# Patient Record
Sex: Female | Born: 1950 | Race: White | Hispanic: No | Marital: Single | Smoking: Never smoker
Health system: Southern US, Community
[De-identification: ages and names within clinical notes are randomized; demographics above are authoritative.]

## PROBLEM LIST (undated history)

## (undated) DIAGNOSIS — E069 Thyroiditis, unspecified: Secondary | ICD-10-CM

## (undated) DIAGNOSIS — N809 Endometriosis, unspecified: Secondary | ICD-10-CM

## (undated) DIAGNOSIS — M810 Age-related osteoporosis without current pathological fracture: Secondary | ICD-10-CM

## (undated) DIAGNOSIS — D219 Benign neoplasm of connective and other soft tissue, unspecified: Secondary | ICD-10-CM

## (undated) HISTORY — DX: Thyroiditis, unspecified: E06.9

## (undated) HISTORY — DX: Endometriosis, unspecified: N80.9

## (undated) HISTORY — DX: Age-related osteoporosis without current pathological fracture: M81.0

## (undated) HISTORY — PX: OOPHORECTOMY: SHX86

## (undated) HISTORY — DX: Benign neoplasm of connective and other soft tissue, unspecified: D21.9

## (undated) HISTORY — PX: APPENDECTOMY: SHX54

## (undated) HISTORY — PX: KNEE SURGERY: SHX244

---

## 1998-07-29 ENCOUNTER — Other Ambulatory Visit: Admission: RE | Admit: 1998-07-29 | Discharge: 1998-07-29 | Payer: Self-pay | Admitting: Obstetrics and Gynecology

## 1999-10-17 ENCOUNTER — Encounter: Payer: Self-pay | Admitting: Obstetrics and Gynecology

## 1999-10-17 ENCOUNTER — Encounter: Admission: RE | Admit: 1999-10-17 | Discharge: 1999-10-17 | Payer: Self-pay | Admitting: Obstetrics and Gynecology

## 1999-10-19 ENCOUNTER — Other Ambulatory Visit: Admission: RE | Admit: 1999-10-19 | Discharge: 1999-10-19 | Payer: Self-pay | Admitting: Obstetrics and Gynecology

## 2000-02-06 ENCOUNTER — Encounter: Admission: RE | Admit: 2000-02-06 | Discharge: 2000-02-06 | Payer: Self-pay | Admitting: Obstetrics and Gynecology

## 2000-02-06 ENCOUNTER — Encounter: Payer: Self-pay | Admitting: Obstetrics and Gynecology

## 2000-10-17 ENCOUNTER — Encounter: Admission: RE | Admit: 2000-10-17 | Discharge: 2000-10-17 | Payer: Self-pay | Admitting: Obstetrics and Gynecology

## 2000-10-17 ENCOUNTER — Encounter: Payer: Self-pay | Admitting: Obstetrics and Gynecology

## 2000-10-18 ENCOUNTER — Encounter: Payer: Self-pay | Admitting: Obstetrics and Gynecology

## 2000-10-18 ENCOUNTER — Encounter: Admission: RE | Admit: 2000-10-18 | Discharge: 2000-10-18 | Payer: Self-pay | Admitting: Obstetrics and Gynecology

## 2000-10-19 ENCOUNTER — Other Ambulatory Visit: Admission: RE | Admit: 2000-10-19 | Discharge: 2000-10-19 | Payer: Self-pay | Admitting: Obstetrics and Gynecology

## 2001-10-21 ENCOUNTER — Other Ambulatory Visit: Admission: RE | Admit: 2001-10-21 | Discharge: 2001-10-21 | Payer: Self-pay | Admitting: Obstetrics and Gynecology

## 2001-10-21 ENCOUNTER — Encounter: Payer: Self-pay | Admitting: Obstetrics and Gynecology

## 2001-10-21 ENCOUNTER — Encounter: Admission: RE | Admit: 2001-10-21 | Discharge: 2001-10-21 | Payer: Self-pay | Admitting: Obstetrics and Gynecology

## 2002-02-07 ENCOUNTER — Encounter: Admission: RE | Admit: 2002-02-07 | Discharge: 2002-02-07 | Payer: Self-pay | Admitting: Obstetrics and Gynecology

## 2002-02-07 ENCOUNTER — Encounter: Payer: Self-pay | Admitting: Obstetrics and Gynecology

## 2002-10-21 ENCOUNTER — Other Ambulatory Visit: Admission: RE | Admit: 2002-10-21 | Discharge: 2002-10-21 | Payer: Self-pay | Admitting: Obstetrics and Gynecology

## 2002-10-22 ENCOUNTER — Encounter: Payer: Self-pay | Admitting: Obstetrics and Gynecology

## 2002-10-22 ENCOUNTER — Encounter: Admission: RE | Admit: 2002-10-22 | Discharge: 2002-10-22 | Payer: Self-pay | Admitting: Obstetrics and Gynecology

## 2003-10-19 ENCOUNTER — Other Ambulatory Visit: Admission: RE | Admit: 2003-10-19 | Discharge: 2003-10-19 | Payer: Self-pay | Admitting: Obstetrics and Gynecology

## 2003-10-20 ENCOUNTER — Encounter: Admission: RE | Admit: 2003-10-20 | Discharge: 2003-10-20 | Payer: Self-pay | Admitting: Obstetrics and Gynecology

## 2004-02-08 ENCOUNTER — Encounter: Admission: RE | Admit: 2004-02-08 | Discharge: 2004-02-08 | Payer: Self-pay | Admitting: Obstetrics and Gynecology

## 2004-10-21 ENCOUNTER — Encounter: Admission: RE | Admit: 2004-10-21 | Discharge: 2004-10-21 | Payer: Self-pay | Admitting: Obstetrics and Gynecology

## 2004-10-25 ENCOUNTER — Other Ambulatory Visit: Admission: RE | Admit: 2004-10-25 | Discharge: 2004-10-25 | Payer: Self-pay | Admitting: Obstetrics and Gynecology

## 2005-10-26 ENCOUNTER — Other Ambulatory Visit: Admission: RE | Admit: 2005-10-26 | Discharge: 2005-10-26 | Payer: Self-pay | Admitting: Obstetrics and Gynecology

## 2006-01-31 ENCOUNTER — Encounter: Admission: RE | Admit: 2006-01-31 | Discharge: 2006-01-31 | Payer: Self-pay | Admitting: Obstetrics and Gynecology

## 2006-10-17 ENCOUNTER — Encounter: Admission: RE | Admit: 2006-10-17 | Discharge: 2006-10-17 | Payer: Self-pay | Admitting: Obstetrics and Gynecology

## 2006-10-18 ENCOUNTER — Other Ambulatory Visit: Admission: RE | Admit: 2006-10-18 | Discharge: 2006-10-18 | Payer: Self-pay | Admitting: Obstetrics and Gynecology

## 2007-10-22 ENCOUNTER — Encounter: Admission: RE | Admit: 2007-10-22 | Discharge: 2007-10-22 | Payer: Self-pay | Admitting: Obstetrics and Gynecology

## 2008-10-21 ENCOUNTER — Encounter: Admission: RE | Admit: 2008-10-21 | Discharge: 2008-10-21 | Payer: Self-pay | Admitting: Obstetrics and Gynecology

## 2008-10-21 ENCOUNTER — Other Ambulatory Visit: Admission: RE | Admit: 2008-10-21 | Discharge: 2008-10-21 | Payer: Self-pay | Admitting: Obstetrics and Gynecology

## 2008-10-21 ENCOUNTER — Ambulatory Visit: Payer: Self-pay | Admitting: Obstetrics and Gynecology

## 2009-10-20 ENCOUNTER — Encounter: Admission: RE | Admit: 2009-10-20 | Discharge: 2009-10-20 | Payer: Self-pay | Admitting: Obstetrics and Gynecology

## 2010-10-20 ENCOUNTER — Ambulatory Visit: Payer: Self-pay | Admitting: Gynecology

## 2010-10-21 ENCOUNTER — Encounter
Admission: RE | Admit: 2010-10-21 | Discharge: 2010-10-21 | Payer: Self-pay | Source: Home / Self Care | Attending: Obstetrics and Gynecology | Admitting: Obstetrics and Gynecology

## 2010-11-19 ENCOUNTER — Encounter: Payer: Self-pay | Admitting: Obstetrics and Gynecology

## 2010-11-20 ENCOUNTER — Encounter: Payer: Self-pay | Admitting: Obstetrics and Gynecology

## 2011-09-14 ENCOUNTER — Other Ambulatory Visit: Payer: Self-pay | Admitting: Obstetrics and Gynecology

## 2011-09-14 DIAGNOSIS — Z1231 Encounter for screening mammogram for malignant neoplasm of breast: Secondary | ICD-10-CM

## 2011-10-20 ENCOUNTER — Ambulatory Visit
Admission: RE | Admit: 2011-10-20 | Discharge: 2011-10-20 | Disposition: A | Discharge status: Home / Self Care | Payer: BC Managed Care – PPO | Source: Ambulatory Visit | Attending: Obstetrics and Gynecology | Admitting: Obstetrics and Gynecology

## 2011-10-20 DIAGNOSIS — Z1231 Encounter for screening mammogram for malignant neoplasm of breast: Secondary | ICD-10-CM

## 2012-08-26 ENCOUNTER — Other Ambulatory Visit: Payer: Self-pay | Admitting: Obstetrics and Gynecology

## 2012-08-26 DIAGNOSIS — Z1231 Encounter for screening mammogram for malignant neoplasm of breast: Secondary | ICD-10-CM

## 2012-10-16 ENCOUNTER — Encounter: Payer: Self-pay | Admitting: Gynecology

## 2012-10-18 ENCOUNTER — Encounter: Payer: Self-pay | Admitting: Obstetrics and Gynecology

## 2012-10-18 ENCOUNTER — Ambulatory Visit (INDEPENDENT_AMBULATORY_CARE_PROVIDER_SITE_OTHER): Payer: BC Managed Care – PPO | Admitting: Obstetrics and Gynecology

## 2012-10-18 ENCOUNTER — Other Ambulatory Visit (HOSPITAL_COMMUNITY)
Admission: RE | Admit: 2012-10-18 | Discharge: 2012-10-18 | Disposition: A | Discharge status: Home / Self Care | Payer: BC Managed Care – PPO | Source: Ambulatory Visit | Attending: Obstetrics and Gynecology | Admitting: Obstetrics and Gynecology

## 2012-10-18 VITALS — BP 120/76 | Ht 65.0 in | Wt 116.0 lb

## 2012-10-18 DIAGNOSIS — Z01419 Encounter for gynecological examination (general) (routine) without abnormal findings: Secondary | ICD-10-CM

## 2012-10-18 NOTE — Progress Notes (Signed)
The patient came to see me today for her annual GYN exam. She lives in [place] but comes to see me when she is here. She had a GYN exam one year ago in [place]. She is using componded oral estriol. She is using Vagifem. She is scheduled for yearly mammogram here. She has significant osteopenia but has not had a bone density since 2007. She previously tried oral biphosphonates but did not do well. She has not had any fractures. She is having no vaginal bleeding. She is having no pelvic pain. She has always had normal Pap smears. In 1989 she had a right salpingo-oophorectomy and left ovarian cystectomy for endometriosis. She is not having dyspareunia.  Physical examination:Kim Julian Reil present. HEENT within normal limits. Neck: Thyroid not large. No masses. Supraclavicular nodes: not enlarged. Breasts: Examined in both sitting and lying  position. No skin changes and no masses. Abdomen: Soft no guarding rebound or masses or hernia. Pelvic: External: Within normal limits. BUS: Within normal limits. Vaginal:within normal limits. Good estrogen effect. No evidence of cystocele rectocele or enterocele. Cervix: clean. Uterus: Normal size and shape. Adnexa: No masses. Rectovaginal exam: Confirmatory and negative. Extremities: Within normal limits.  Assessment: #1. Osteopenia #2. Menopausal symptoms responding to treatment.  Plan: Mammogram. Bone density in . Treatment for bone loss in . We discussed Prolia today. Asked her to talk to the gynecologist who prescribes estriol to see if she can give it to her in a non-oral form. Continue Vagifem. Pap done.The new Pap smear guidelines were discussed with the patient.

## 2012-10-18 NOTE — Patient Instructions (Signed)
Mammogram. Schedule  bone density in [place].

## 2012-10-19 LAB — URINALYSIS W MICROSCOPIC + REFLEX CULTURE
Bacteria, UA: NONE SEEN
Casts: NONE SEEN
Crystals: NONE SEEN
Glucose, UA: NEGATIVE mg/dL
Hgb urine dipstick: NEGATIVE
Ketones, ur: NEGATIVE mg/dL
Leukocytes, UA: NEGATIVE
Nitrite: NEGATIVE
Specific Gravity, Urine: 1.011 (ref 1.005–1.030)
Urobilinogen, UA: 0.2 mg/dL (ref 0.0–1.0)
pH: 6.5 (ref 5.0–8.0)

## 2012-10-21 ENCOUNTER — Ambulatory Visit
Admission: RE | Admit: 2012-10-21 | Discharge: 2012-10-21 | Disposition: A | Discharge status: Home / Self Care | Payer: BC Managed Care – PPO | Source: Ambulatory Visit | Attending: Obstetrics and Gynecology | Admitting: Obstetrics and Gynecology

## 2012-10-21 DIAGNOSIS — Z1231 Encounter for screening mammogram for malignant neoplasm of breast: Secondary | ICD-10-CM

## 2013-09-11 ENCOUNTER — Other Ambulatory Visit: Payer: Self-pay

## 2013-09-11 DIAGNOSIS — Z1231 Encounter for screening mammogram for malignant neoplasm of breast: Secondary | ICD-10-CM

## 2013-10-20 ENCOUNTER — Ambulatory Visit
Admission: RE | Admit: 2013-10-20 | Discharge: 2013-10-20 | Disposition: A | Discharge status: Home / Self Care | Payer: BC Managed Care – PPO | Source: Ambulatory Visit

## 2013-10-20 DIAGNOSIS — Z1231 Encounter for screening mammogram for malignant neoplasm of breast: Secondary | ICD-10-CM

## 2013-10-21 ENCOUNTER — Ambulatory Visit (INDEPENDENT_AMBULATORY_CARE_PROVIDER_SITE_OTHER): Payer: BC Managed Care – PPO | Admitting: Gynecology

## 2013-10-21 ENCOUNTER — Encounter: Payer: Self-pay | Admitting: Gynecology

## 2013-10-21 VITALS — BP 106/64 | Ht 65.0 in | Wt 120.6 lb

## 2013-10-21 DIAGNOSIS — M8580 Other specified disorders of bone density and structure, unspecified site: Secondary | ICD-10-CM

## 2013-10-21 DIAGNOSIS — Z01419 Encounter for gynecological examination (general) (routine) without abnormal findings: Secondary | ICD-10-CM

## 2013-10-21 DIAGNOSIS — Z79890 Hormone replacement therapy (postmenopausal): Secondary | ICD-10-CM

## 2013-10-21 DIAGNOSIS — N952 Postmenopausal atrophic vaginitis: Secondary | ICD-10-CM

## 2013-10-21 DIAGNOSIS — M899 Disorder of bone, unspecified: Secondary | ICD-10-CM

## 2013-10-21 NOTE — Patient Instructions (Signed)
Followup for bone density. Schedule colonoscopy. Followup for annual exam in one year.

## 2013-10-21 NOTE — Progress Notes (Signed)
Roberta Logan 05-28-1951 [id number]        62 y.o.  G0P0 for annual exam.  Former patient of Dr. Eda Paschal. Several issues noted below.  Past medical history,surgical history, problem list, medications, allergies, family history and social history were all reviewed and documented in the EPIC chart.  ROS:  Performed and pertinent positives and negatives are included in the history, assessment and plan .  Exam: [name] assistant Filed Vitals:   10/21/13 1102  BP: 106/64  Height: 5\' 5"  (1.651 m)  Weight: 120 lb 9.6 oz (54.704 kg)   General appearance  Normal Skin grossly normal Head/Neck normal with no cervical or supraclavicular adenopathy thyroid normal Lungs  clear Cardiac RR, without RMG Abdominal  soft, nontender, without masses, organomegaly or hernia Breasts  examined lying and sitting without masses, retractions, discharge or axillary adenopathy. Pelvic  Ext/BUS/vagina  Normal with atrophic changes  Cervix  Normal with atrophic changes  Uterus  anteverted, normal size, shape and contour, midline and mobile nontender   Adnexa  Without masses or tenderness    Anus and perineum  Normal   Rectovaginal  Normal sphincter tone without palpated masses or tenderness.    Assessment/Plan:  62 y.o. G0P0 female for annual exam.   1. Postmenopausal/HRT. History of endometriosis and leiomyoma status post laparoscopic right salpingo-oophorectomy and left ovarian cystectomy 1989. Patient is on a bioequivalent estriol and progesterone cream to a physician/compounding facility in [place]. Also uses Vagifem intermittently as needed for vaginal dryness. Has been on this for years and wants to continue.  I reviewed the whole issue of HRT with her to include the WHI study with increased risk of stroke, heart attack, DVT and breast cancer. The ACOG and NAMS statements for lowest dose for the shortest period of time reviewed. Transdermal versus oral first-pass effect benefit discussed.  The issues  of bioequivalent hormones and less well studied formulations. Whether progesterone cream protects the endometrium and whether pharmaceutical grade studies applied to bioequivalent formulated medications all reviewed. Patient understands the issues and risks and wants to continue and she'll continue to arrange this through her [facility] facility. 2. Osteopenia. DEXA 2007 with T score -2.4. Transiently on Fosamax/Boniva but discontinued for what sounds like more of her concerns about the risks rather than the side effects.  I reviewed with her her significant risk of fracture given her thin status Caucasian race. HRT beneficial but not guaranteed protective. Risks benefits of various medications to include bisphosphate temporally a reviewed. Osteonecrosis of the jaw, atypical fractures particularly with prolonged use, GERD and esophageal cancer as well as rashes and infections all discussed. Strongly recommended patient followup for bone density and discussion about treatment options. Patient was to arrange in [place] per Dr. Verl Dicker note but never did so. She agrees to arrange either they or or schedule one here when she comes back. Increase calcium and vitamin D reviewed. 3. Pap smear 2013. No Pap smear done today. No history of abnormal Pap smears previously. Plan repeat at 3 year intervals. 4. Mammography 09/2013. Continue with annual mammography. SBE monthly reviewed. 5. Colonoscopy 2002. Strongly recommended scheduling colonoscopy either here or in [place]. Benefits of early detection and precancerous polyp removal discussed. Patient agrees to arrange. 6. Health maintenance. The blood work done as this has all been done through her primary physician in [place] and was reportedly just recently done. Followup as noted above.   Note: This document was prepared with digital dictation and possible smart phrase technology. Any transcriptional errors that  result from this process are  unintentional.   Dara Lords MD, 11:37 AM 10/21/2013

## 2013-10-22 LAB — URINALYSIS W MICROSCOPIC + REFLEX CULTURE
Bacteria, UA: NONE SEEN
Bilirubin Urine: NEGATIVE
Casts: NONE SEEN
Crystals: NONE SEEN
Glucose, UA: NEGATIVE mg/dL
Hgb urine dipstick: NEGATIVE
Ketones, ur: NEGATIVE mg/dL
Leukocytes, UA: NEGATIVE
Nitrite: NEGATIVE
Protein, ur: NEGATIVE mg/dL
Specific Gravity, Urine: 1.01 (ref 1.005–1.030)
Squamous Epithelial / HPF: NONE SEEN
Urobilinogen, UA: 0.2 mg/dL (ref 0.0–1.0)
pH: 6.5 (ref 5.0–8.0)

## 2014-02-11 ENCOUNTER — Telehealth: Payer: Self-pay | Admitting: *Deleted

## 2014-02-11 NOTE — Telephone Encounter (Signed)
Pt called c/o increase price of vagifem 10 mcg to $200 too expensive for patient. Pt asked if there are any other options for her to try? Please advise

## 2014-02-11 NOTE — Telephone Encounter (Signed)
She can try vaginal estrogen cream such as Premarin/Estrace 1 g intravaginal twice weekly, which can be expensive and $70-$80 a month. [facility] formulated estradiol vaginal cream twice weekly was $45 for 3 months supply last time we checked.

## 2014-02-12 NOTE — Telephone Encounter (Signed)
Pt said she would like to try premarin or estrace. She asked if you would choose which would best of here. Please advise

## 2014-02-13 MED ORDER — ESTROGENS, CONJUGATED 0.625 MG/GM VA CREA: TOPICAL_CREAM | VAGINAL | Status: DC

## 2014-02-13 NOTE — Telephone Encounter (Signed)
Premarin vaginal cream one quarter applicator twice weekly dispense one tube refill x6. Tell patient to check with website as they may have a coupon to help with cost.

## 2014-02-13 NOTE — Telephone Encounter (Signed)
Patient aware rx sent  

## 2014-02-16 ENCOUNTER — Telehealth: Payer: Self-pay | Admitting: *Deleted

## 2014-02-16 NOTE — Telephone Encounter (Signed)
Rx faxed to [phone]

## 2014-02-16 NOTE — Telephone Encounter (Signed)
Okay for Vagifem 10 mcg #24, one per vagina twice weekly refill x4

## 2014-02-16 NOTE — Telephone Encounter (Signed)
Pt lives in [place] had Rx sent on 02/11/14 for premarin vaginal cream, has decided not to take medication. Pt said she would like to continue vagifem 10 mcg she has found a pharmacy in [place] that will be a lot cheaper for her to get medication. She gave me the fax # for Rx to be faxed. We are not able to fax printed Rx from epic. If you are okay with this pt will need a written Rx on prescription pad for vagifem, once this has been done I will fax. Please advise

## 2014-09-28 ENCOUNTER — Other Ambulatory Visit: Payer: Self-pay

## 2014-09-28 DIAGNOSIS — Z1231 Encounter for screening mammogram for malignant neoplasm of breast: Secondary | ICD-10-CM

## 2014-10-02 ENCOUNTER — Ambulatory Visit (INDEPENDENT_AMBULATORY_CARE_PROVIDER_SITE_OTHER): Payer: BC Managed Care – PPO | Admitting: Women's Health

## 2014-10-02 ENCOUNTER — Encounter: Payer: Self-pay | Admitting: Women's Health

## 2014-10-02 ENCOUNTER — Other Ambulatory Visit (HOSPITAL_COMMUNITY)
Admission: RE | Admit: 2014-10-02 | Discharge: 2014-10-02 | Disposition: A | Discharge status: Home / Self Care | Payer: BC Managed Care – PPO | Source: Ambulatory Visit | Attending: Gynecology | Admitting: Gynecology

## 2014-10-02 VITALS — BP 118/80 | Wt 122.0 lb

## 2014-10-02 DIAGNOSIS — Z01419 Encounter for gynecological examination (general) (routine) without abnormal findings: Secondary | ICD-10-CM | POA: Diagnosis present

## 2014-10-02 DIAGNOSIS — E038 Other specified hypothyroidism: Secondary | ICD-10-CM

## 2014-10-02 DIAGNOSIS — M8580 Other specified disorders of bone density and structure, unspecified site: Secondary | ICD-10-CM

## 2014-10-02 NOTE — Progress Notes (Signed)
Roberta Logan 02-12-1951 [id number]    History:    Presents for annual exam.  Postmenopausal/no bleeding on compounded estrogen and progesterone per pharmacy in [place]. RSO and left ovarian cystectomy 1989. DEXA 2007 T score -2.4 at left hip was on Boniva but stopped. Has not had Zostavax. Colonoscopy -2002, planning to schedule. Not sexually active.  Past medical history, past surgical history, family history and social history were all reviewed and documented in the EPIC chart. 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  ROS:  A  12 point ROS was performed and pertinent positives and negatives are included.  Exam:  Filed Vitals:   10/02/14 1357  BP: 118/80    General appearance:  Normal Thyroid:  Symmetrical, normal in size, without palpable masses or nodularity. Respiratory  Auscultation:  Clear without wheezing or rhonchi Cardiovascular  Auscultation:  Regular rate, without rubs, murmurs or gallops  Edema/varicosities:  Not grossly evident Abdominal  Soft,nontender, without masses, guarding or rebound.  Liver/spleen:  No organomegaly noted  Hernia:  None appreciated  Skin  Inspection:  Grossly normal   Breasts: Examined lying and sitting.     Right: Without masses, retractions, discharge or axillary adenopathy.     Left: Without masses, retractions, discharge or axillary adenopathy. Gentitourinary   Inguinal/mons:  Normal without inguinal adenopathy  External genitalia:  Normal  BUS/Urethra/Skene's glands:  Normal  Vagina:  Atrophic  Cervix:  Normal  Uterus:   normal in size, shape and contour.  Midline and mobile  Adnexa/parametria:     Rt: Without masses or tenderness.   Lt: Without masses or tenderness.  Anus and perineum: Normal  Digital rectal exam: Normal sphincter tone without palpated masses or tenderness  Assessment/Plan:  63 y.o. SWF G0 for annual exam.    Postmenopausal/no bleeding on compounded HRT RSO/left ovarian cystectomy  1989 Osteopenia  Plan: SBE's, annual mammogram, 3-D tomography reviewed and encouraged history of dense breasts. Continue regular exercise, importance of weightbearing exercises, home safety and fall prevention reviewed. Repeat DEXA, will schedule. Planning to schedule repeat colonoscopy in [place]. HRT reviewed best to use shortest amount of time, reviewed compounded HRT not FDA regulated, risks of blood clots, strokes and breast cancer reviewed. Zostavax recommended. Requested TSH, UA and pap, Pap normal 2013, new screening guidelines reviewed. labs at primary care.  Huel Cote [facility], 5:11 PM 10/02/2014

## 2014-10-03 LAB — URINALYSIS W MICROSCOPIC + REFLEX CULTURE
Bacteria, UA: NONE SEEN
Bilirubin Urine: NEGATIVE
CRYSTALS: NONE SEEN
Casts: NONE SEEN
Glucose, UA: NEGATIVE mg/dL
Hgb urine dipstick: NEGATIVE
Ketones, ur: NEGATIVE mg/dL
Leukocytes, UA: NEGATIVE
Nitrite: NEGATIVE
Protein, ur: NEGATIVE mg/dL
Specific Gravity, Urine: 1.01 (ref 1.005–1.030)
Squamous Epithelial / HPF: NONE SEEN
Urobilinogen, UA: 0.2 mg/dL (ref 0.0–1.0)
pH: 7.5 (ref 5.0–8.0)

## 2014-10-06 LAB — CYTOLOGY - PAP

## 2014-10-21 ENCOUNTER — Ambulatory Visit
Admission: RE | Admit: 2014-10-21 | Discharge: 2014-10-21 | Disposition: A | Discharge status: Home / Self Care | Payer: BC Managed Care – PPO | Source: Ambulatory Visit

## 2014-10-21 DIAGNOSIS — Z1231 Encounter for screening mammogram for malignant neoplasm of breast: Secondary | ICD-10-CM

## 2015-08-03 ENCOUNTER — Other Ambulatory Visit: Payer: Self-pay

## 2015-08-03 DIAGNOSIS — Z1231 Encounter for screening mammogram for malignant neoplasm of breast: Secondary | ICD-10-CM

## 2015-10-21 ENCOUNTER — Ambulatory Visit
Admission: RE | Admit: 2015-10-21 | Discharge: 2015-10-21 | Disposition: A | Discharge status: Home / Self Care | Payer: BLUE CROSS/BLUE SHIELD | Source: Ambulatory Visit

## 2015-10-21 ENCOUNTER — Ambulatory Visit (INDEPENDENT_AMBULATORY_CARE_PROVIDER_SITE_OTHER): Payer: BLUE CROSS/BLUE SHIELD | Admitting: Women's Health

## 2015-10-21 ENCOUNTER — Encounter: Payer: Self-pay | Admitting: Women's Health

## 2015-10-21 VITALS — BP 124/80 | Ht 65.0 in | Wt 122.0 lb

## 2015-10-21 DIAGNOSIS — N952 Postmenopausal atrophic vaginitis: Secondary | ICD-10-CM

## 2015-10-21 DIAGNOSIS — Z1231 Encounter for screening mammogram for malignant neoplasm of breast: Secondary | ICD-10-CM

## 2015-10-21 DIAGNOSIS — Z01419 Encounter for gynecological examination (general) (routine) without abnormal findings: Secondary | ICD-10-CM | POA: Diagnosis not present

## 2015-10-21 MED ORDER — ESTRADIOL 10 MCG VA TABS: ORAL_TABLET | VAGINAL | Status: DC

## 2015-10-21 NOTE — Patient Instructions (Signed)

## 2015-10-21 NOTE — Progress Notes (Signed)
Roberta Logan 1951/06/30 [id number]    History:    Presents for annual exam.  Postmenopausal with no bleeding on compounded estrogen progesterone from a pharmacy in [place]. 1989 RSO for benign cyst. Normal Pap and mammogram history. Same partner many years. 2007 T score -2.4 on Boniva in the past has not had a recent DEXA. Has not received Zostavax. 2002 negative colonoscopy. Reports normal labs at primary care in [place].  Past medical history, past surgical history, family history and social history were all reviewed and documented in the EPIC chart. 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  ROS:  A ROS was performed and pertinent positives and negatives are included.  Exam:  Filed Vitals:   10/21/15 1357  BP: 124/80    General appearance:  Normal Thyroid:  Symmetrical, normal in size, without palpable masses or nodularity. Respiratory  Auscultation:  Clear without wheezing or rhonchi Cardiovascular  Auscultation:  Regular rate, without rubs, murmurs or gallops  Edema/varicosities:  Not grossly evident Abdominal  Soft,nontender, without masses, guarding or rebound.  Liver/spleen:  No organomegaly noted  Hernia:  None appreciated  Skin  Inspection:  Grossly normal   Breasts: Examined lying and sitting.     Right: Without masses, retractions, discharge or axillary adenopathy.     Left: Without masses, retractions, discharge or axillary adenopathy. Gentitourinary   Inguinal/mons:  Normal without inguinal adenopathy  External genitalia:  Normal  BUS/Urethra/Skene's glands:  Normal  Vagina:  Normal  Cervix:  Normal  Uterus:   normal in size, shape and contour.  Midline and mobile  Adnexa/parametria:     Rt: Without masses or tenderness.   Lt: Without masses or tenderness.  Anus and perineum: Normal  Digital rectal exam: Normal sphincter tone without palpated masses or tenderness  Assessment/Plan:  64 y.o. S WF G0 for annual exam with no  complaints.  Postmenopausal with no bleeding on compounded estrogen progesterone from compounding pharmacy in [place] Vaginal atrophy some relief with Vagifem Osteopenia  Plan: HRT reviewed risks of blood clots, strokes, breast cancer. Reviewed less is best, shortest amount of time. Vagifem 1 applicator twice weekly prescription, proper use given and reviewed animal systemic absorption but still low risk. Vaginal lubricants. SBE's, continue annual 3-D screening mammogram history of dense breasts. Calcium rich diet, vitamin D 2000 daily encouraged. Home safety, fall prevention and importance of continuing regular exercise. Zostavax recommended. Pneumovax at 8. Annual flu vaccine recommended. Normal Pap 2015, new screening guidelines reviewed.  Huel Cote [facility], 4:22 PM 10/21/2015

## 2015-10-22 LAB — URINALYSIS W MICROSCOPIC + REFLEX CULTURE
Bacteria, UA: NONE SEEN [HPF]
Bilirubin Urine: NEGATIVE
Casts: NONE SEEN [LPF]
Crystals: NONE SEEN [HPF]
Glucose, UA: NEGATIVE
Hgb urine dipstick: NEGATIVE
Ketones, ur: NEGATIVE
Leukocytes, UA: NEGATIVE
Nitrite: NEGATIVE
PH: 6.5 (ref 5.0–8.0)
Protein, ur: NEGATIVE
Specific Gravity, Urine: 1.018 (ref 1.001–1.035)
WBC, UA: NONE SEEN WBC/HPF (ref <=5)
YEAST: NONE SEEN [HPF]

## 2015-10-23 LAB — URINE CULTURE
COLONY COUNT: NO GROWTH
ORGANISM ID, BACTERIA: NO GROWTH

## 2016-08-16 ENCOUNTER — Other Ambulatory Visit: Payer: Self-pay | Admitting: Women's Health

## 2016-08-16 DIAGNOSIS — Z1231 Encounter for screening mammogram for malignant neoplasm of breast: Secondary | ICD-10-CM

## 2016-10-19 ENCOUNTER — Ambulatory Visit (INDEPENDENT_AMBULATORY_CARE_PROVIDER_SITE_OTHER): Payer: Medicare Other | Admitting: Women's Health

## 2016-10-19 ENCOUNTER — Encounter: Payer: Self-pay | Admitting: Women's Health

## 2016-10-19 ENCOUNTER — Ambulatory Visit
Admission: RE | Admit: 2016-10-19 | Discharge: 2016-10-19 | Disposition: A | Discharge status: Home / Self Care | Payer: Medicare Other | Source: Ambulatory Visit | Attending: Women's Health | Admitting: Women's Health

## 2016-10-19 DIAGNOSIS — N952 Postmenopausal atrophic vaginitis: Secondary | ICD-10-CM | POA: Diagnosis not present

## 2016-10-19 DIAGNOSIS — Z1231 Encounter for screening mammogram for malignant neoplasm of breast: Secondary | ICD-10-CM

## 2016-10-19 MED ORDER — ESTRADIOL 10 MCG VA TABS: ORAL_TABLET | VAGINAL | 4 refills | Status: AC

## 2016-10-19 NOTE — Progress Notes (Signed)
Roberta Logan June 29, 1951 [id number]    History:    Presents for breast and pelvic exam.  Postmenopausal with no bleeding, on compounded estrogen and progesterone from pharmacy in [place], Vagifem, no bleeding.  Normal Pap and mammogram history. Same partner for many years.  65 RSO for benign cyst.  2007 T score -2.4 on Boniva in the past.  Has not had a recent DEXA.  2002 Colonoscopy. Has not had Pneumovax or Zostavax.     Past medical history, past surgical history, family history and social history were all reviewed and documented in the EPIC chart. 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  Exam:  Vitals:   10/19/16 1406  BP: 134/80  Weight: 124 lb (56.2 kg)  Height: 5\' 5"  (1.651 m)   Body mass index is 20.63 kg/m.   General appearance:  Normal Thyroid:  Symmetrical, normal in size, without palpable masses or nodularity. Respiratory  Auscultation:  Clear without wheezing or rhonchi Cardiovascular  Auscultation:  Regular rate, without rubs, murmurs or gallops  Edema/varicosities:  Not grossly evident Abdominal  Soft,nontender, without masses, guarding or rebound.  Liver/spleen:  No organomegaly noted  Hernia:  None appreciated  Skin  Inspection:  Grossly normal   Breasts: Examined lying and sitting.     Right: Without masses, retractions, discharge or axillary adenopathy.     Left: Without masses, retractions, discharge or axillary adenopathy. Gentitourinary   Inguinal/mons:  Normal without inguinal adenopathy  External genitalia:  Normal  BUS/Urethra/Skene's glands:  Normal  Vagina:  Atrophic  Cervix:  Normal  Uterus:  Normal in size, shape and contour.  Midline and mobile  Adnexa/parametria:     Rt: Without masses or tenderness.   Lt: Without masses or tenderness.  Anus and perineum: Normal  Digital rectal exam: Normal sphincter tone without palpated  masses or tenderness  Assessment/Plan:  65 y.o.  SWF G0 for breast and pelvic exam with no complaints.  Postmenopausal, on HRT, no bleeding Vaginal Atrophy  Plan: Reviewed HRT and risk of blood clots, stroke, and breast cancer.  Encouraged to decrease use and reviewed best to use for shortest amount of time.  Vagifem 1 applicator at bedtime times twice weekly prescription, proper use given and reviewed. Continue SBEs, regular exercise, calcium rich diet. Pap 2015 normal. Mammogram scheduled today, 10/19/2016. Recommend Pneumovax and Zostavax..  Will follow up with primary care regarding DEXA, colonoscopy, and labs.      Huel Cote [facility], 3:04 PM 10/19/2016

## 2016-10-19 NOTE — Patient Instructions (Signed)

## 2016-10-24 ENCOUNTER — Ambulatory Visit: Payer: BLUE CROSS/BLUE SHIELD

## 2017-09-17 ENCOUNTER — Other Ambulatory Visit: Payer: Self-pay | Admitting: Women's Health

## 2017-09-17 DIAGNOSIS — Z1231 Encounter for screening mammogram for malignant neoplasm of breast: Secondary | ICD-10-CM

## 2017-10-24 ENCOUNTER — Encounter: Payer: Medicare Other | Admitting: Women's Health

## 2017-10-24 ENCOUNTER — Ambulatory Visit
Admission: RE | Admit: 2017-10-24 | Discharge: 2017-10-24 | Disposition: A | Discharge status: Home / Self Care | Payer: Medicare Other | Source: Ambulatory Visit | Attending: Women's Health | Admitting: Women's Health

## 2017-10-24 DIAGNOSIS — Z1231 Encounter for screening mammogram for malignant neoplasm of breast: Secondary | ICD-10-CM

## 2018-07-23 ENCOUNTER — Other Ambulatory Visit: Payer: Self-pay | Admitting: Women's Health

## 2018-07-23 DIAGNOSIS — Z1231 Encounter for screening mammogram for malignant neoplasm of breast: Secondary | ICD-10-CM

## 2018-10-15 ENCOUNTER — Encounter: Payer: Medicare Other | Admitting: Women's Health

## 2018-10-18 ENCOUNTER — Ambulatory Visit
Admission: RE | Admit: 2018-10-18 | Discharge: 2018-10-18 | Disposition: A | Discharge status: Home / Self Care | Payer: Medicare Other | Source: Ambulatory Visit | Attending: Women's Health | Admitting: Women's Health

## 2018-10-18 DIAGNOSIS — Z1231 Encounter for screening mammogram for malignant neoplasm of breast: Secondary | ICD-10-CM

## 2020-06-07 IMAGING — MG DIGITAL SCREENING BILATERAL MAMMOGRAM WITH TOMO AND CAD
8 series · 9 of 24 positions shown · non-contrast
Comparison: Previous exam(s).

CLINICAL DATA: Screening.

EXAM:
DIGITAL SCREENING BILATERAL MAMMOGRAM WITH TOMO AND CAD

[L CC synth-2D]
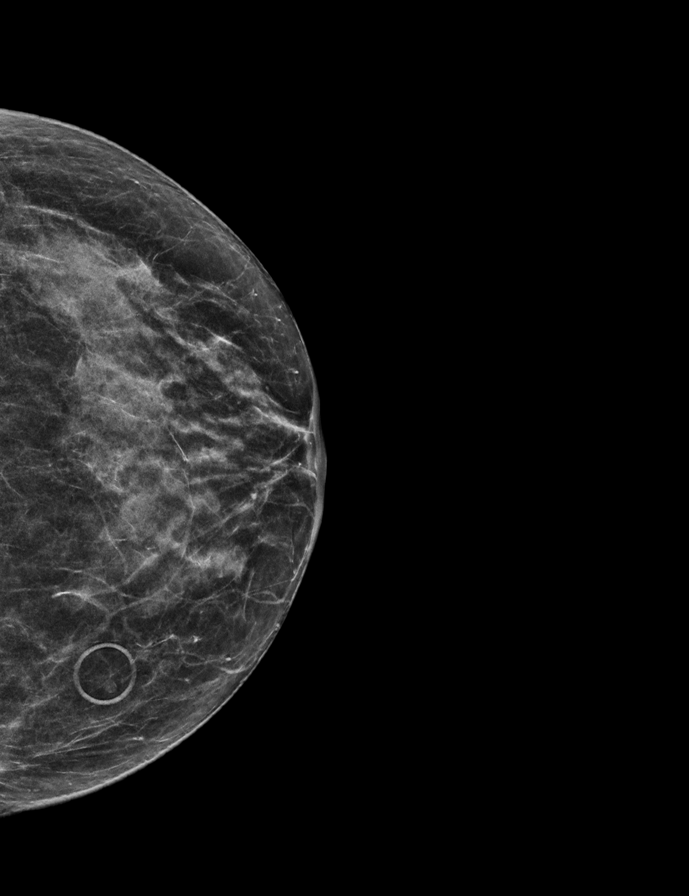

[R MLO synth-2D]
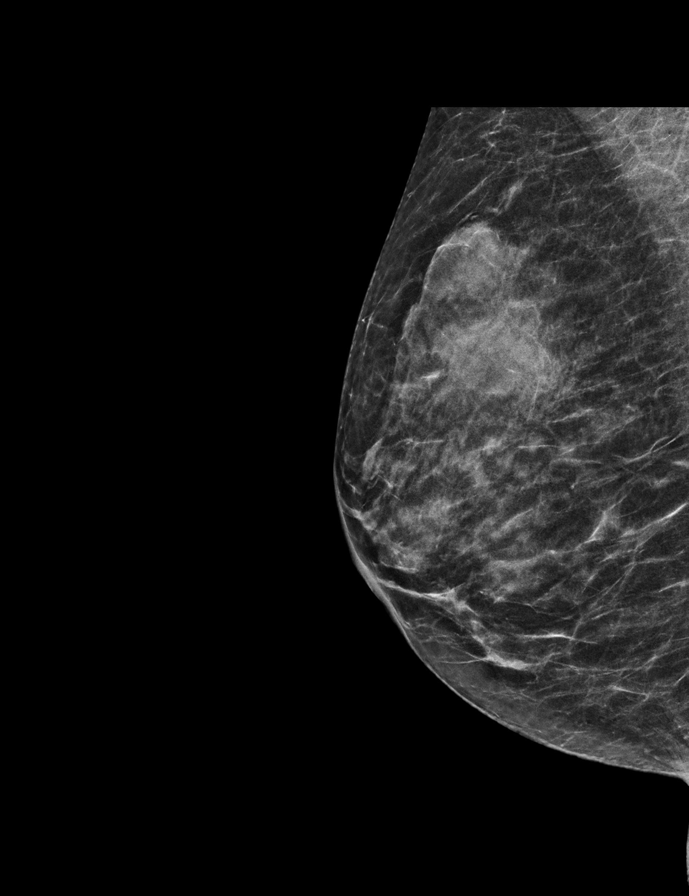

[R CC synth-2D]
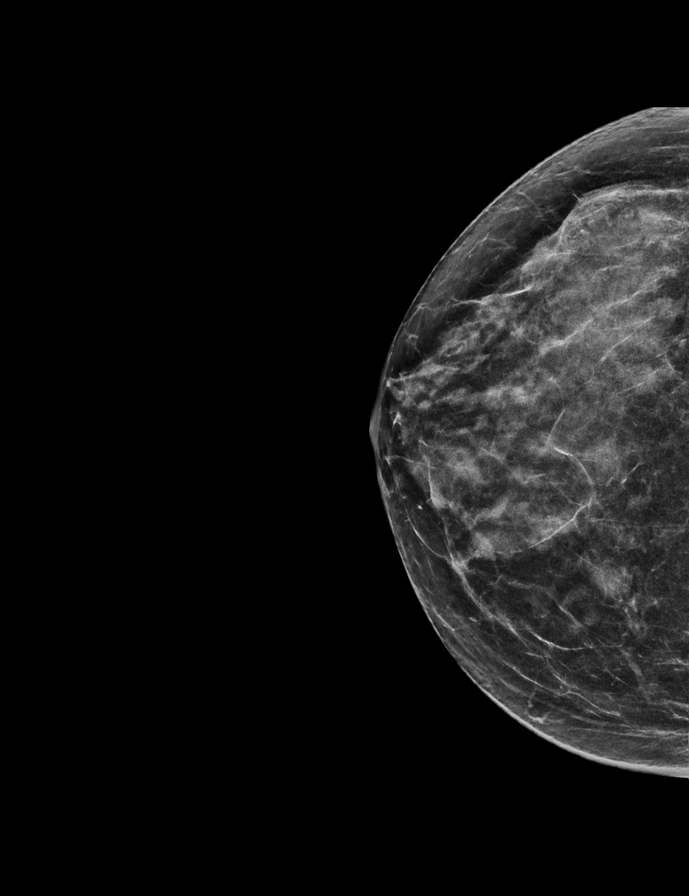

[L MLO synth-2D]
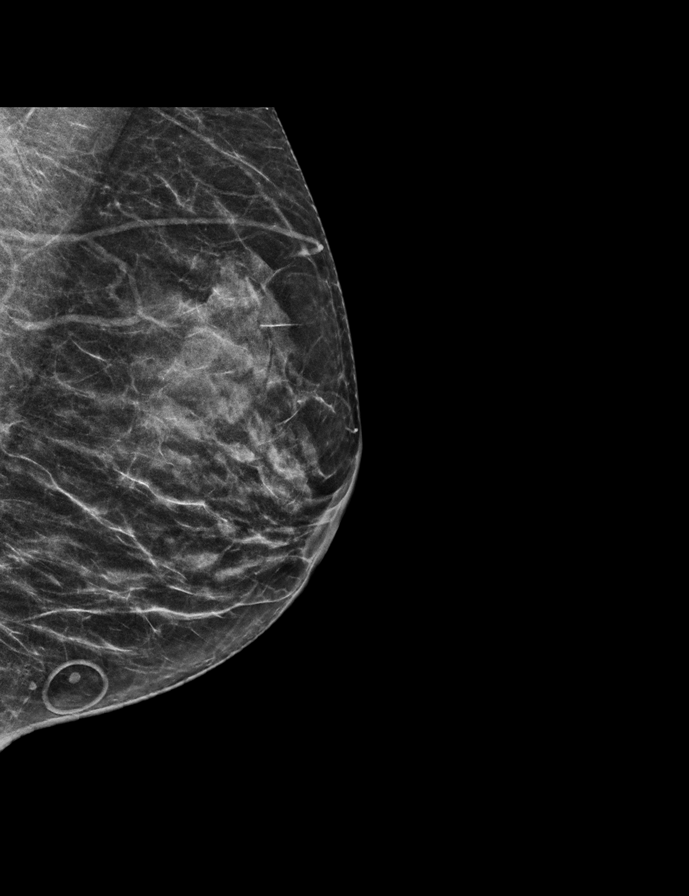

[R CC tomo · 2 of 52 frames shown]
[frame 17/52]
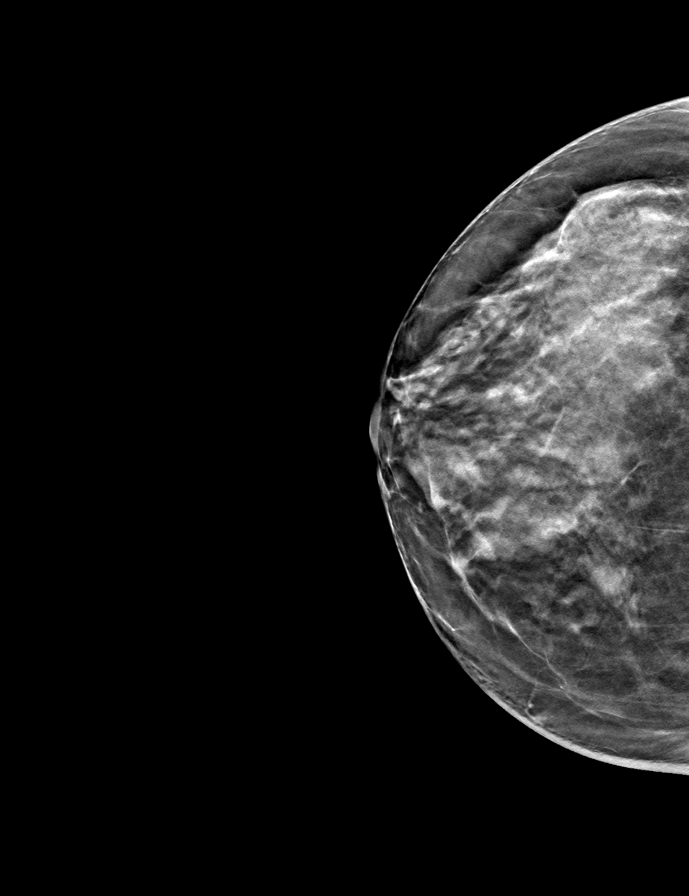
[frame 27/52]
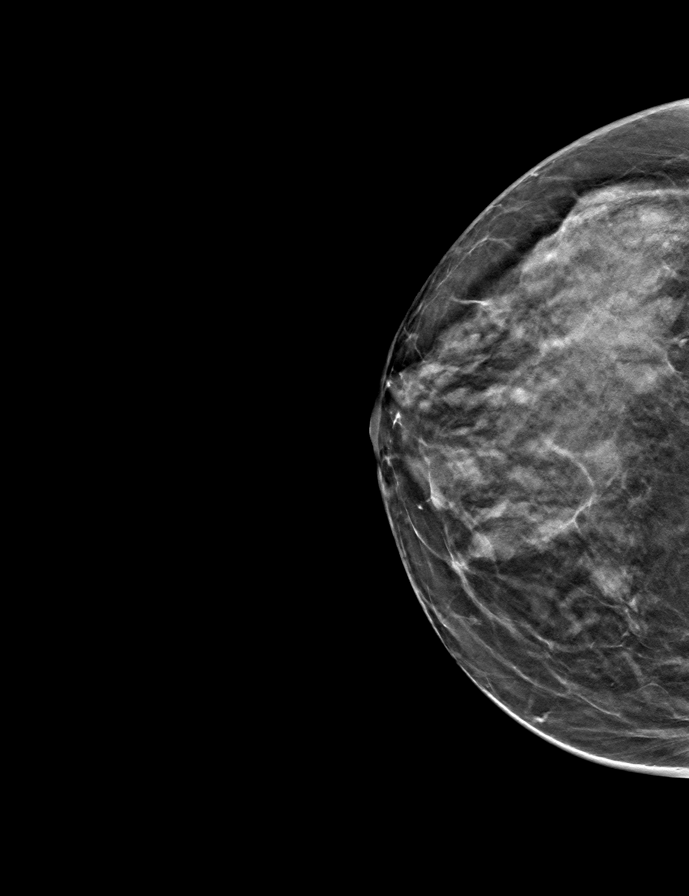

[R MLO tomo · tomo slice 26/51.0]
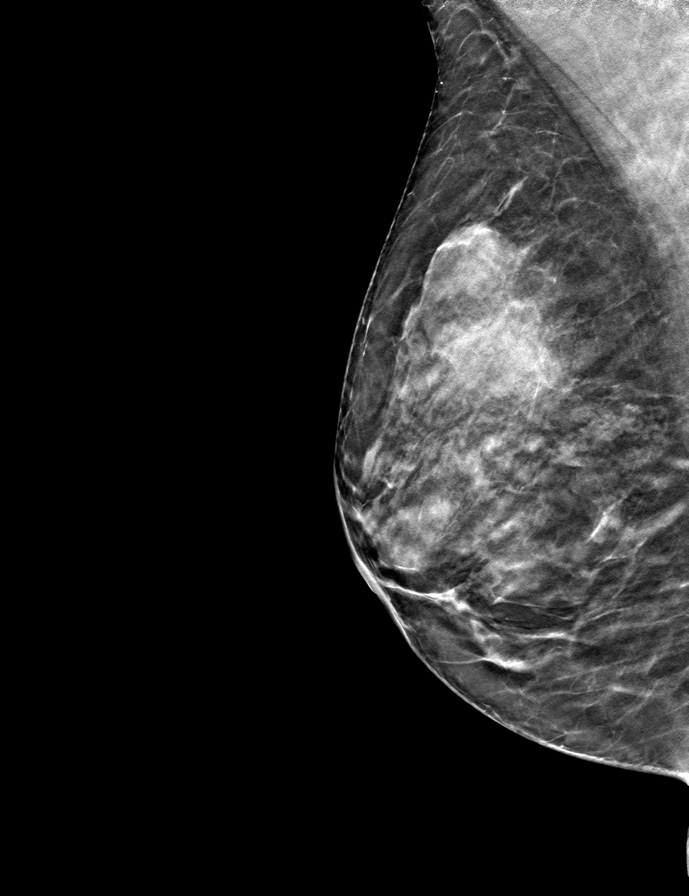

[L MLO tomo · tomo slice 25/50.0]
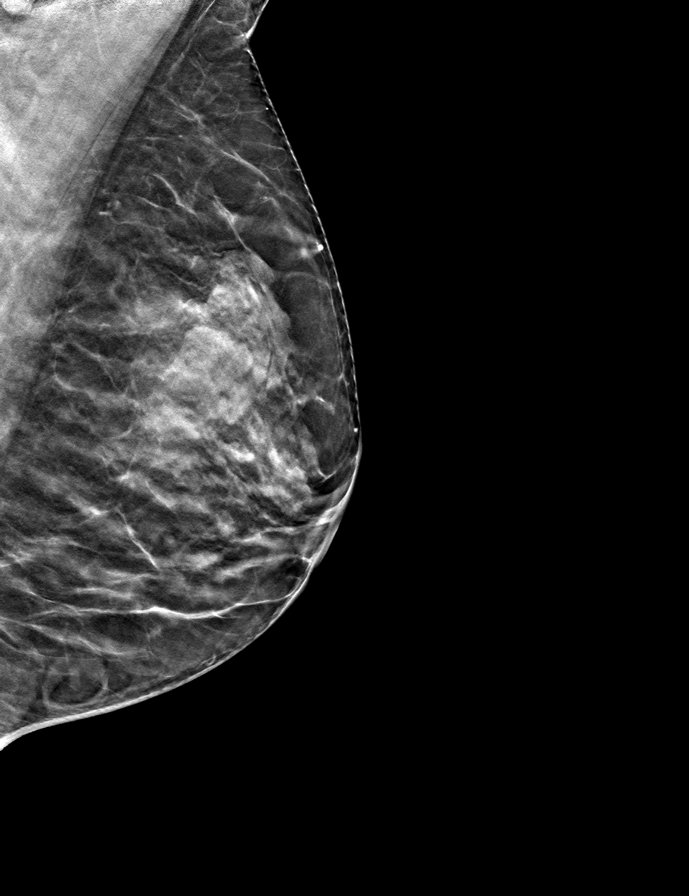

[L CC tomo · tomo slice 25/50.0]
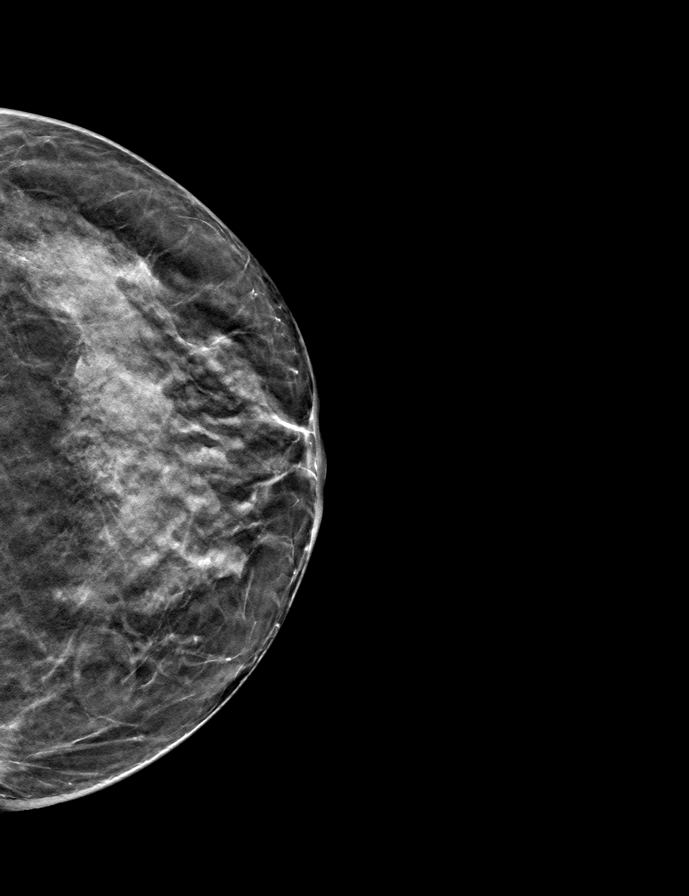

[9 of 24 positions shown; findings below may reference images not displayed]

ACR Breast Density Category c: The breast tissue is heterogeneously
dense, which may obscure small masses.
FINDINGS: There are no findings suspicious for malignancy. Images were
processed with CAD.
IMPRESSION: No mammographic evidence of malignancy. A result letter of this
screening mammogram will be mailed directly to the patient.

RECOMMENDATION:
Screening mammogram in one year. (Code:FT-U-LHB)

BI-RADS CATEGORY  1: Negative.
